# Patient Record
Sex: Male | Born: 1975 | Race: Asian | Hispanic: No | Marital: Married | Smoking: Former smoker
Health system: Southern US, Community
[De-identification: ages and names within clinical notes are randomized; demographics above are authoritative.]

## PROBLEM LIST (undated history)

## (undated) DIAGNOSIS — K219 Gastro-esophageal reflux disease without esophagitis: Secondary | ICD-10-CM

---

## 2017-12-14 ENCOUNTER — Emergency Department (HOSPITAL_COMMUNITY): Payer: Self-pay

## 2017-12-14 ENCOUNTER — Encounter (HOSPITAL_COMMUNITY): Payer: Self-pay

## 2017-12-14 DIAGNOSIS — Z5321 Procedure and treatment not carried out due to patient leaving prior to being seen by health care provider: Secondary | ICD-10-CM | POA: Insufficient documentation

## 2017-12-14 LAB — CBC
HCT: 42.9 % (ref 39.0–52.0)
Hemoglobin: 15.4 g/dL (ref 13.0–17.0)
MCH: 31.3 pg (ref 26.0–34.0)
MCHC: 35.9 g/dL (ref 30.0–36.0)
MCV: 87.2 fL (ref 78.0–100.0)
PLATELETS: 209 10*3/uL (ref 150–400)
RBC: 4.92 MIL/uL (ref 4.22–5.81)
RDW: 12.7 % (ref 11.5–15.5)
WBC: 6.8 10*3/uL (ref 4.0–10.5)

## 2017-12-14 LAB — I-STAT TROPONIN, ED: TROPONIN I, POC: 0 ng/mL (ref 0.00–0.08)

## 2017-12-14 LAB — BASIC METABOLIC PANEL
Anion gap: 10 (ref 5–15)
BUN: 9 mg/dL (ref 6–20)
CALCIUM: 9.5 mg/dL (ref 8.9–10.3)
CHLORIDE: 101 mmol/L (ref 101–111)
CO2: 29 mmol/L (ref 22–32)
CREATININE: 0.88 mg/dL (ref 0.61–1.24)
GFR calc non Af Amer: >60 mL/min (ref >60)
Glucose, Bld: 120 mg/dL — ABNORMAL HIGH (ref 65–99)
Potassium: 3.8 mmol/L (ref 3.5–5.1)
Sodium: 140 mmol/L (ref 135–145)
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
## 2017-12-15 ENCOUNTER — Emergency Department (HOSPITAL_COMMUNITY)
Admission: EM | Admit: 2017-12-15 | Discharge: 2017-12-15 | Discharge status: Against Medical Advice | Payer: Self-pay | Attending: Emergency Medicine | Admitting: Emergency Medicine

## 2017-12-15 HISTORY — DX: Gastro-esophageal reflux disease without esophagitis: K21.9

## 2017-12-15 NOTE — ED Notes (Signed)
No call from patient when called from lobby for 3rd and final time.

## 2017-12-15 NOTE — ED Notes (Signed)
No call from patient when called for a room x 1.

## 2017-12-15 NOTE — ED Notes (Signed)
No call from patient for second time when called for room.

## 2019-10-09 IMAGING — CR DG CHEST 2V
2 series · 2 of 2 positions shown · non-contrast
Comparison: None.

CLINICAL DATA: Initial evaluation for acute chest pain, back pain.

EXAM:
CHEST - 2 VIEW

[w chest pa]
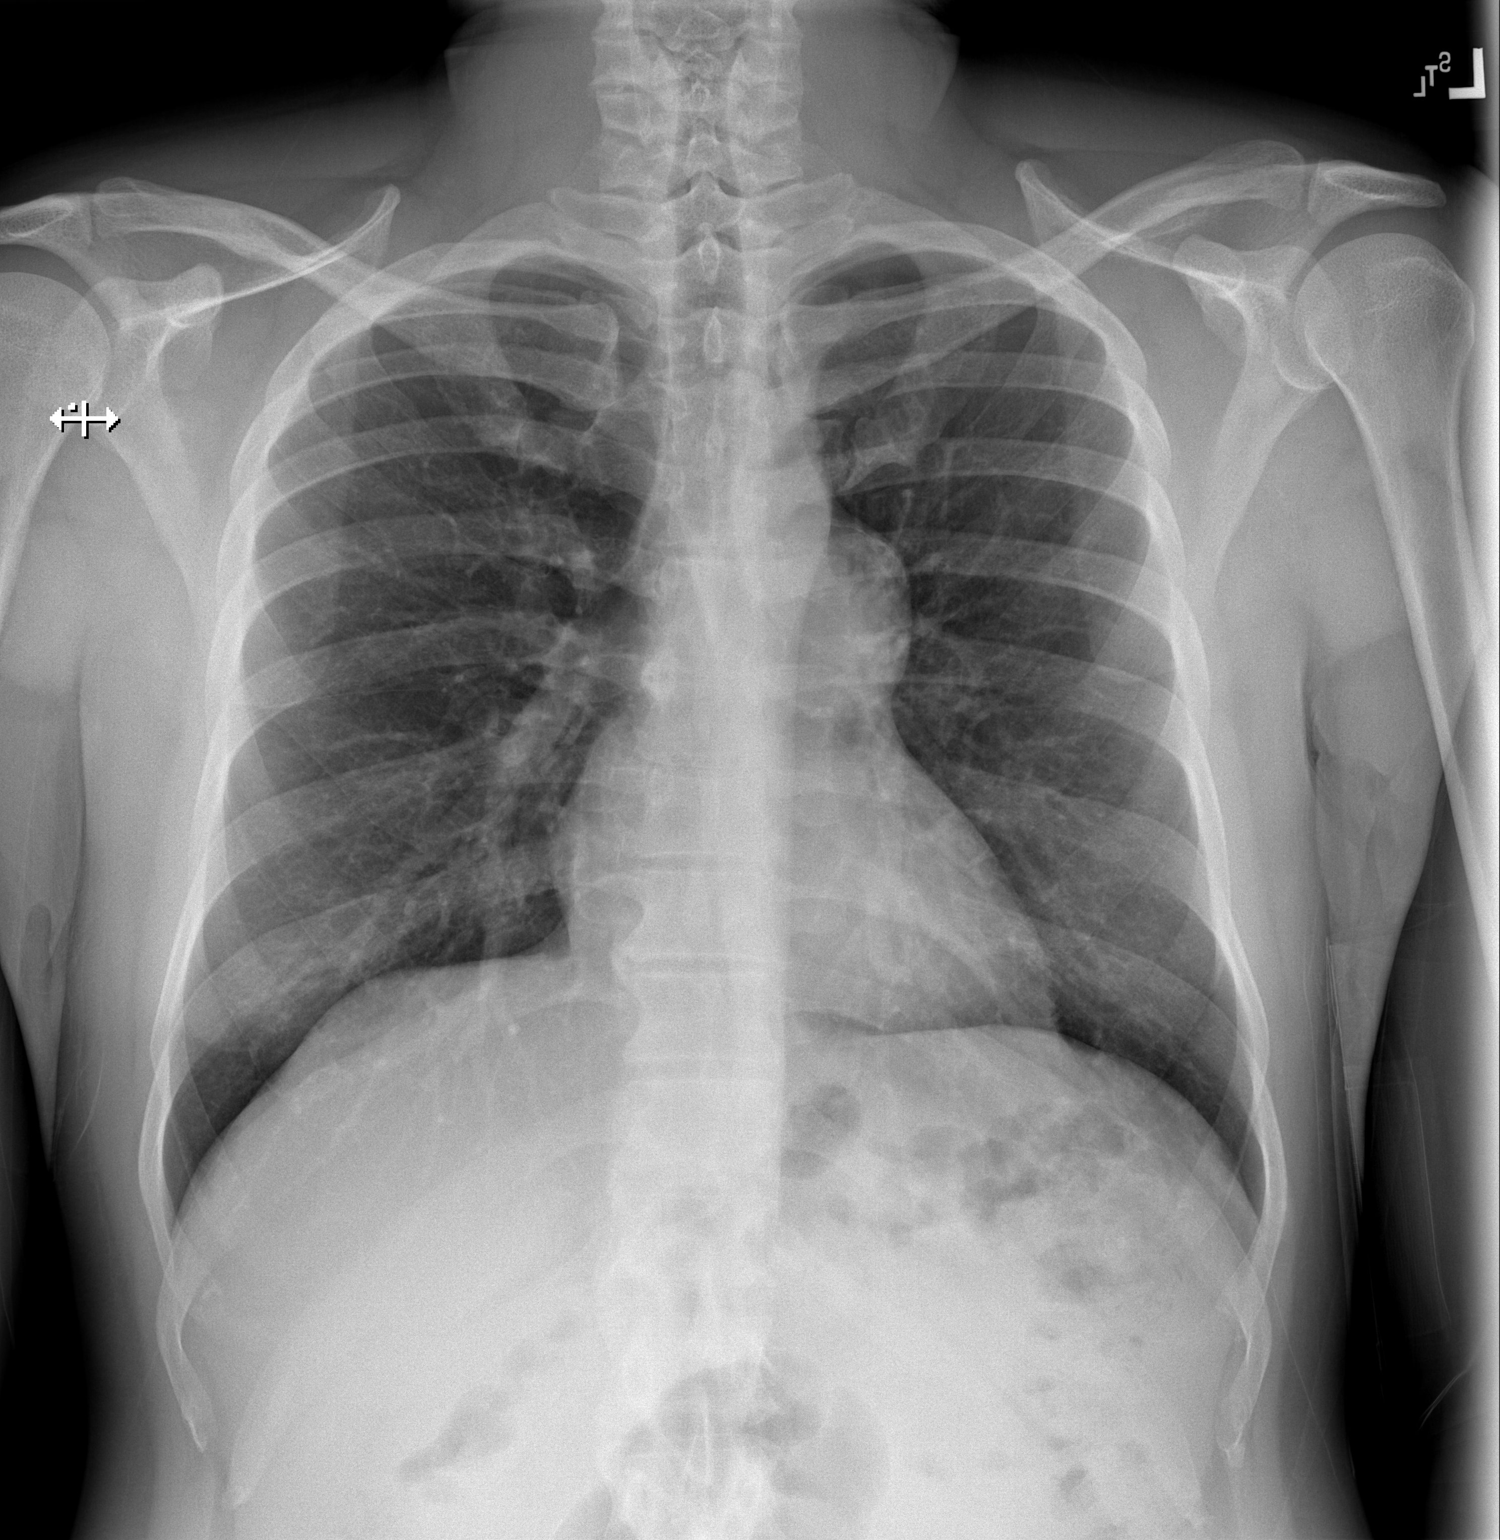

[w chest lat]
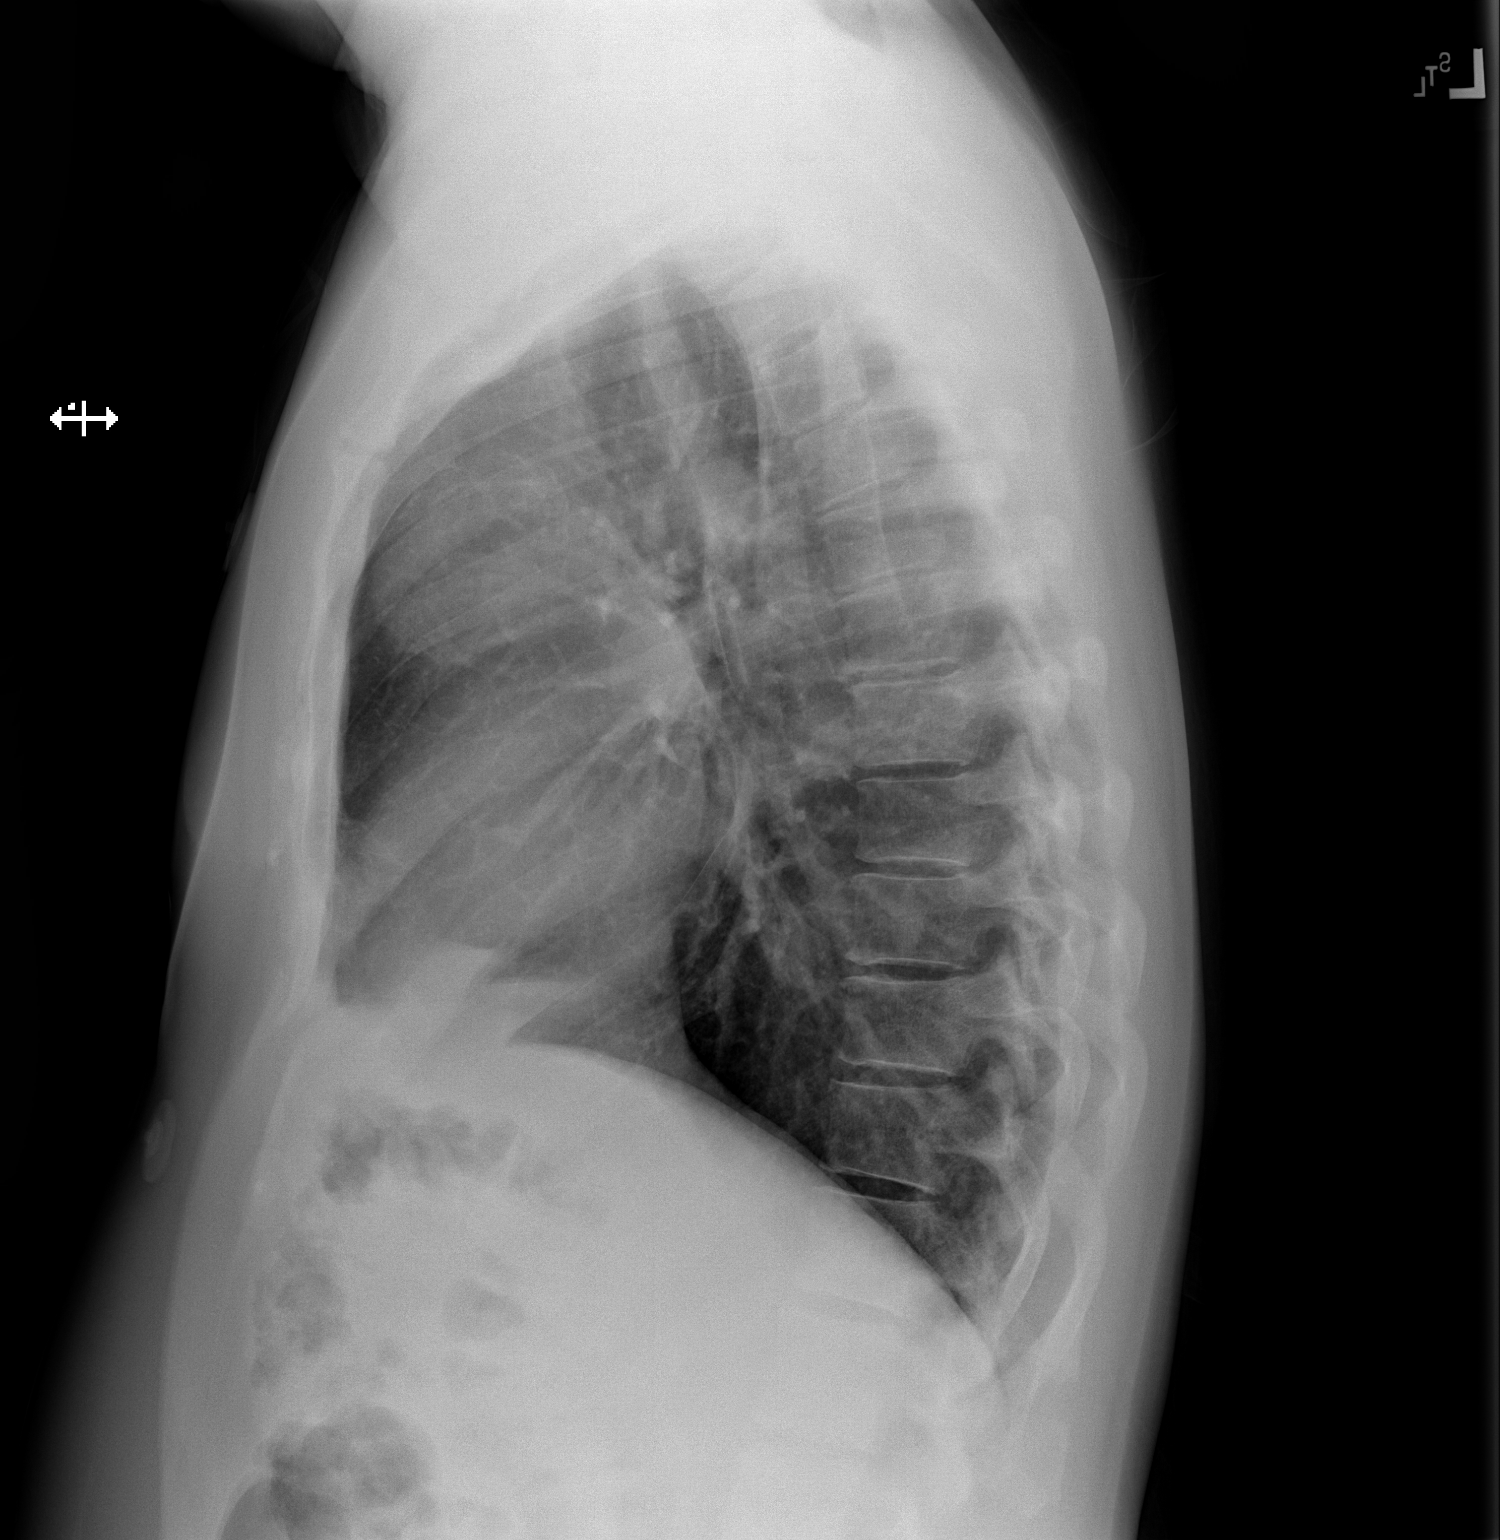

[2 of 2 positions shown; findings below may reference images not displayed]

FINDINGS: The cardiac and mediastinal silhouettes are within normal limits.

The lungs are normally inflated. No airspace consolidation, pleural
effusion, or pulmonary edema is identified. There is no
pneumothorax.

No acute osseous abnormality identified.
IMPRESSION: No active cardiopulmonary disease.
